# Patient Record
Sex: Male | Born: 1960 | Race: White | Hispanic: No | Marital: Married | Smoking: Never smoker
Health system: Southern US, Community
[De-identification: ages and names within clinical notes are randomized; demographics above are authoritative.]

## PROBLEM LIST (undated history)

## (undated) DIAGNOSIS — M1990 Unspecified osteoarthritis, unspecified site: Secondary | ICD-10-CM

## (undated) DIAGNOSIS — K625 Hemorrhage of anus and rectum: Secondary | ICD-10-CM

## (undated) HISTORY — DX: Hemorrhage of anus and rectum: K62.5

## (undated) HISTORY — DX: Unspecified osteoarthritis, unspecified site: M19.90

## (undated) HISTORY — PX: OTHER SURGICAL HISTORY: SHX169

---

## 2006-06-30 ENCOUNTER — Emergency Department (HOSPITAL_COMMUNITY): Admission: EM | Admit: 2006-06-30 | Discharge: 2006-06-30 | Payer: Self-pay | Admitting: Emergency Medicine

## 2006-07-03 ENCOUNTER — Ambulatory Visit: Payer: Self-pay | Admitting: Cardiology

## 2006-07-05 ENCOUNTER — Ambulatory Visit: Payer: Self-pay | Admitting: Cardiology

## 2006-07-12 ENCOUNTER — Ambulatory Visit: Payer: Self-pay

## 2006-07-28 ENCOUNTER — Ambulatory Visit: Payer: Self-pay | Admitting: Cardiology

## 2011-04-20 ENCOUNTER — Ambulatory Visit (INDEPENDENT_AMBULATORY_CARE_PROVIDER_SITE_OTHER): Payer: Self-pay | Admitting: Family Medicine

## 2011-04-20 ENCOUNTER — Encounter: Payer: Self-pay | Admitting: Family Medicine

## 2011-04-20 VITALS — BP 160/88 | HR 72 | Temp 98.0°F | Resp 12 | Ht 72.5 in | Wt 211.0 lb

## 2011-04-20 DIAGNOSIS — R030 Elevated blood-pressure reading, without diagnosis of hypertension: Secondary | ICD-10-CM

## 2011-04-20 DIAGNOSIS — N4889 Other specified disorders of penis: Secondary | ICD-10-CM

## 2011-04-20 DIAGNOSIS — N489 Disorder of penis, unspecified: Secondary | ICD-10-CM

## 2011-04-20 NOTE — Progress Notes (Signed)
  Subjective:    Patient ID: Jeffrey Mcknight, male    DOB: 06/29/1960, 50 y.o.   MRN: [id number]  HPI  New patient for 15 minute appointment. Patient recently had some mild tender left inguinal adenopathy which last only a few days. That has fully resolved. He is concerned because of some prominence of left-sided penile vein. Nonpainful. Noted about 2 weeks ago. Somewhat less prominent today. No penile discharge. No rashes. Denies fever or chills. No history of curvature of penis with erection. Patient was concerned because his wife left him about 3 months ago and he is concerned about STD issues. Again no penile discharge, fever, dysuria, or rash. He is not aware of any definite exposures. No history of STD.  Review of Systems  Constitutional: Negative for fever, chills, fatigue and unexpected weight change.  Respiratory: Negative for shortness of breath.   Cardiovascular: Negative for chest pain.  Gastrointestinal: Negative for abdominal pain.  Genitourinary: Negative for dysuria, urgency, hematuria, penile swelling, scrotal swelling, genital sores, penile pain and testicular pain.  Skin: Negative for rash.       Objective:   Physical Exam  Constitutional: He appears well-developed and well-nourished.  Cardiovascular: Normal rate and regular rhythm.   Pulmonary/Chest: Effort normal and breath sounds normal. No respiratory distress. He has no wheezes. He has no rales.  Genitourinary:       Somewhat prominent thickened vein left-sided penis. No mass. No skin changes. No urethral discharge. No inguinal adenopathy          Assessment & Plan:  Prominent vein left-sided penis. Otherwise normal exam. Nonpainful. Per patient this is improving. Observe for now. Urology referral if any further problems.  Elevated blood pressure. We strongly recommend he bring in home cuff and c/w ours. He's had consistent readings around 130 systolic or less at home.  STD screening discussed with patient and  he is not interested at this time.

## 2011-04-20 NOTE — Patient Instructions (Signed)
Monitor blood pressure and consider bringing in your BP cuff to check with ours.

## 2014-03-18 ENCOUNTER — Telehealth: Payer: Self-pay | Admitting: Family Medicine

## 2014-03-18 NOTE — Telephone Encounter (Signed)
Can you  Please schedule appointment for patient tomorrow

## 2014-03-18 NOTE — Telephone Encounter (Signed)
Pt called back and is scheduled for 03/19/14 at 0945

## 2014-03-18 NOTE — Telephone Encounter (Signed)
Patient Information:  Caller Name: Jeffrey Mcknight  Phone: [phone]  Patient: Jeffrey Mcknight, Jeffrey Mcknight  Gender: Male  DOB: 10-20-1960  Age: 53 Years  PCP: Evelena PeatBurchette, [facility](Family Practice)  Office Follow Up:  Does the office need to follow up with this patient?: Yes  Instructions For The Office: Patient has not been seen at office since 2012; one episode of bleeding with BM today.  Please follow up regarding possible appointment.  Deferred scheduling to office due to possible complex appointment needed.  Best callback number is [phone].  Thank you.   Symptoms  Reason For Call & Symptoms: Patient reports he has blood with stools onset 03/18/14 x one episode.  Toilet water is red "like red kool aid"; unable to see bottom of the toilet.  Reviewed Health History In EMR: Yes  Reviewed Medications In EMR: Yes  Reviewed Allergies In EMR: Yes  Reviewed Surgeries / Procedures: Yes  Date of Onset of Symptoms: 03/18/2014  Guideline(s) Used:  No Protocol Available - Sick Adult  Disposition Per Guideline:   See Today in Office  Reason For Disposition Reached:   Nursing judgment  Advice Given:  Call Back If:  New symptoms develop  You become worse.  Patient Will Follow Care Advice:  YES

## 2014-03-19 ENCOUNTER — Encounter: Payer: Self-pay | Admitting: Family Medicine

## 2014-03-19 ENCOUNTER — Ambulatory Visit (INDEPENDENT_AMBULATORY_CARE_PROVIDER_SITE_OTHER): Payer: BC Managed Care – PPO | Admitting: Family Medicine

## 2014-03-19 VITALS — BP 152/88 | HR 97 | Temp 97.8°F | Wt 204.0 lb

## 2014-03-19 DIAGNOSIS — K921 Melena: Secondary | ICD-10-CM

## 2014-03-19 LAB — CBC WITH DIFFERENTIAL/PLATELET
Basophils Absolute: 0 10*3/uL (ref 0.0–0.1)
Basophils Relative: 0.1 % (ref 0.0–3.0)
Eosinophils Absolute: 0.1 10*3/uL (ref 0.0–0.7)
Eosinophils Relative: 1.6 % (ref 0.0–5.0)
HCT: 50.3 % (ref 39.0–52.0)
HEMOGLOBIN: 17 g/dL (ref 13.0–17.0)
LYMPHS ABS: 2 10*3/uL (ref 0.7–4.0)
LYMPHS PCT: 27.4 % (ref 12.0–46.0)
MCHC: 33.9 g/dL (ref 30.0–36.0)
MCV: 98.1 fl (ref 78.0–100.0)
MONO ABS: 0.6 10*3/uL (ref 0.1–1.0)
MONOS PCT: 8.6 % (ref 3.0–12.0)
NEUTROS PCT: 62.3 % (ref 43.0–77.0)
Neutro Abs: 4.6 10*3/uL (ref 1.4–7.7)
Platelets: 209 10*3/uL (ref 150.0–400.0)
RBC: 5.13 Mil/uL (ref 4.22–5.81)
RDW: 12.9 % (ref 11.5–15.5)
WBC: 7.4 10*3/uL (ref 4.0–10.5)

## 2014-03-19 NOTE — Patient Instructions (Signed)
Bloody Stools  Bloody stools often mean that there is a problem in the digestive tract. Your caregiver may use the term "melena" to describe black, tarry, and bad smelling stools or "hematochezia" to describe red or maroon-colored stools. Blood seen in the stool can be caused by bleeding anywhere along the intestinal tract.   A black stool usually means that blood is coming from the upper part of the gastrointestinal tract (esophagus, stomach, or small bowel). Passing maroon-colored stools or bright red blood usually means that blood is coming from lower down in the large bowel or the rectum. However, sometimes massive bleeding in the stomach or small intestine can cause bright red bloody stools.   Consuming black licorice, lead, iron pills, medicines containing bismuth subsalicylate, or blueberries can also cause black stools. Your caregiver can test black stools to see if blood is present.  It is important that the cause of the bleeding be found. Treatment can then be started, and the problem can be corrected. Rectal bleeding may not be serious, but you should not assume everything is okay until you know the cause. It is very important to follow up with your caregiver or a specialist in gastrointestinal problems.  CAUSES   Blood in the stools can come from various underlying causes. Often, the cause is not found during your first visit. Testing is often needed to discover the cause of bleeding in the gastrointestinal tract. Causes range from simple to serious or even life-threatening. Possible causes include:  · Hemorrhoids. These are veins that are full of blood (engorged) in the rectum. They cause pain, inflammation, and may bleed.  · Anal fissures. These are areas of painful tearing which may bleed. They are often caused by passing hard stool.  · Diverticulosis. These are pouches that form on the colon over time, with age, and may bleed significantly.  · Diverticulitis. This is inflammation in areas with  diverticulosis. It can cause pain, fever, and bloody stools, although bleeding is rare.  · Proctitis and colitis. These are inflamed areas of the rectum or colon. They may cause pain, fever, and bloody stools.  · Polyps and cancer. Colon cancer is a leading cause of preventable cancer death. It often starts out as precancerous polyps that can be removed during a colonoscopy, preventing progression into cancer. Sometimes, polyps and cancer may cause rectal bleeding.  · Gastritis and ulcers. Bleeding from the upper gastrointestinal tract (near the stomach) may travel through the intestines and produce black, sometimes tarry, often bad smelling stools. In certain cases, if the bleeding is fast enough, the stools may not be black, but red and the condition may be life-threatening.  SYMPTOMS   You may have stools that are bright red and bloody, that are normal color with blood on them, or that are dark black and tarry. In some cases, you may only have blood in the toilet bowl. Any of these cases need medical care. You may also have:  · Pain at the anus or anywhere in the rectum.  · Lightheadedness or feeling faint.  · Extreme weakness.  · Nausea or vomiting.  · Fever.  DIAGNOSIS  Your caregiver may use the following methods to find the cause of your bleeding:  · Taking a medical history. Age is important. Older people tend to develop polyps and cancer more often. If there is anal pain and a hard, large stool associated with bleeding, a tear of the anus may be the cause. If blood drips into the toilet after a bowel movement, bleeding hemorrhoids may be the   problem. The color and frequency of the bleeding are additional considerations. In most cases, the medical history provides clues, but seldom the final answer.  · A visual and finger (digital) exam. Your caregiver will inspect the anal area, looking for tears and hemorrhoids. A finger exam can provide information when there is tenderness or a growth inside. In men, the  prostate is also examined.  · Endoscopy. Several types of small, long scopes (endoscopes) are used to view the colon.  ¨ In the office, your caregiver may use a rigid, or more commonly, a flexible viewing sigmoidoscope. This exam is called flexible sigmoidoscopy. It is performed in 5 to 10 minutes.  ¨ A more thorough exam is accomplished with a colonoscope. It allows your caregiver to view the entire 5 to 6 foot long colon. Medicine to help you relax (sedative) is usually given for this exam. Frequently, a bleeding lesion may be present beyond the reach of the sigmoidoscope. So, a colonoscopy may be the best exam to start with. Both exams are usually done on an outpatient basis. This means the patient does not stay overnight in the hospital or surgery center.  ¨ An upper endoscopy may be needed to examine your stomach. Sedation is used and a flexible endoscope is put in your mouth, down to your stomach.  · A barium enema X-ray. This is an X-ray exam. It uses liquid barium inserted by enema into the rectum. This test alone may not identify an actual bleeding point. X-rays highlight abnormal shadows, such as those made by lumps (tumors), diverticuli, or colitis.  TREATMENT   Treatment depends on the cause of your bleeding.   · For bleeding from the stomach or colon, the caregiver doing your endoscopy or colonoscopy may be able to stop the bleeding as part of the procedure.  · Inflammation or infection of the colon can be treated with medicines.  · Many rectal problems can be treated with creams, suppositories, or warm baths.  · Surgery is sometimes needed.  · Blood transfusions are sometimes needed if you have lost a lot of blood.  · For any bleeding problem, let your caregiver know if you take aspirin or other blood thinners regularly.  HOME CARE INSTRUCTIONS   · Take any medicines exactly as prescribed.  · Keep your stools soft by eating a diet high in fiber. Prunes (1 to 3 a day) work well for many people.  · Drink  enough water and fluids to keep your urine clear or pale yellow.  · Take sitz baths if advised. A sitz bath is when you sit in a bathtub with warm water for 10 to 15 minutes to soak, soothe, and cleanse the rectal area.  · If enemas or suppositories are advised, be sure you know how to use them. Tell your caregiver if you have problems with this.  · Monitor your bowel movements to look for signs of improvement or worsening.  SEEK MEDICAL CARE IF:   · You do not improve in the time expected.  · Your condition worsens after initial improvement.  · You develop any new symptoms.  SEEK IMMEDIATE MEDICAL CARE IF:   · You develop severe or prolonged rectal bleeding.  · You vomit blood.  · You feel weak or faint.  · You have a fever.  MAKE SURE YOU:  · Understand these instructions.  · Will watch your condition.  · Will get help right away if you are not doing well or get worse.    Document Released: 04/29/2002 Document Revised: 08/01/2011 Document Reviewed: 09/24/2010  ExitCare® Patient Information ©2015 [employer]. This information is not intended to replace advice given to you by your health care provider. Make sure you discuss any questions you have with your health care provider.

## 2014-03-19 NOTE — Progress Notes (Signed)
   Subjective:    Patient ID: Jeffrey Mcknight, male    DOB: 01-23-1961, 53 y.o.   MRN: [id number]  Rectal Bleeding  Associated symptoms include diarrhea. Pertinent negatives include no fever, no abdominal pain, no nausea, no rectal pain, no vomiting, no hematuria, no chest pain, no headaches and no coughing.   53 year old man otherwise healthy presents today acutely for rectal bleeding x 1 day.  Had a bout of diarrhea yesterday morning and later in the day had another formed movement that filled the toilet bowel with an estimated 1-2 oz of bright red blood.  No more blood has been noted since then.  No associated pain with the episode.  Denies constipation.  Never had colonoscopy or completed heme occult cards.  Denies changes in diet; hasn't eaten much since the bloody bout.  Denies fevers, N/V, SOB or chest pains.  Has been having morning gas pains that make him feel bloated and burn.  Bowel movements seem to relieve symptoms.  Goes regularly 2-3 times a day.  For past year has had a sensation that something is falling out; he thinks either internal hemorrhoids or a prolapse sensation with straining.  As a child he had chronic LLQ pains, that they assumed were diverticula.  No scopes since then to confirm.     Review of Systems  Constitutional: Negative for fever, chills, activity change, appetite change and fatigue.       Has not eaten since the event, but feels very hungry.  Respiratory: Negative for cough, chest tightness and shortness of breath.   Cardiovascular: Negative for chest pain.  Gastrointestinal: Positive for diarrhea, blood in stool, hematochezia and anal bleeding. Negative for nausea, vomiting, abdominal pain, constipation, abdominal distention and rectal pain.  Genitourinary: Negative for dysuria, frequency, hematuria and difficulty urinating.  Neurological: Negative for dizziness, syncope, weakness, numbness and headaches.       Objective:   Physical Exam  Nursing note and  vitals reviewed. Constitutional: He is oriented to person, place, and time. He appears well-developed and well-nourished. No distress.  Eyes: Conjunctivae are normal. No scleral icterus.  Cardiovascular: Normal rate, regular rhythm and normal heart sounds.  Exam reveals no gallop and no friction rub.   No murmur heard. Pulmonary/Chest: Effort normal and breath sounds normal. No respiratory distress. He has no wheezes.  Abdominal: Soft. Bowel sounds are normal. He exhibits no distension. There is no rebound and no guarding.  Slight tenderness around the umbilicus.  Genitourinary: Guaiac positive stool.  No fissures noted.  No external hemorrhoids.  Heme positive.    Neurological: He is alert and oriented to person, place, and time.  Skin: Skin is warm and dry. He is not diaphoretic. No pallor.  Normal cap refill and no petechia.    Psychiatric: He has a normal mood and affect. His behavior is normal. Judgment and thought content normal.       Assessment & Plan:  Hematochezia-  DDx: fissure, hemorrhoids, polyps.  DRE done today showing no fissure or hemorrhoids.  No red flags such as weight loss or loss of appetite to suggest malignancy.  Obtain CBC to check for anemia.  Refer to GI for colonosocpy.  No hematochezia has been noted since the isolated event.     Luiz OchoaMelissa Shankar Silber, PA-S  As above.  One episode of bright blood per rectum.  ?etiology.  We discussed possible anoscopy but he needs colonoscopy anyway (never screened) and he agrees with referral.  Delene RuffiniBruce Burchette M.D.

## 2014-03-19 NOTE — Progress Notes (Signed)
Pre visit review using our clinic review tool, if applicable. No additional management support is needed unless otherwise documented below in the visit note. 

## 2014-03-20 ENCOUNTER — Encounter: Payer: Self-pay | Admitting: Internal Medicine

## 2014-03-20 ENCOUNTER — Telehealth: Payer: Self-pay | Admitting: Family Medicine

## 2014-03-20 NOTE — Telephone Encounter (Signed)
Pt is calling to inform md [facility] can not see him until dec for colonoscopy. Please advise

## 2014-03-20 NOTE — Telephone Encounter (Signed)
Is it okay to wait till Dec. ?

## 2014-03-20 NOTE — Telephone Encounter (Signed)
Yes- unless bleeding heavy in the meantime.

## 2014-03-20 NOTE — Telephone Encounter (Signed)
Pt informed

## 2014-03-21 ENCOUNTER — Telehealth: Payer: Self-pay | Admitting: Family Medicine

## 2014-03-21 NOTE — Telephone Encounter (Signed)
Pt informed to check with [facility]. Dr Mann/Hung and Dr. Kinnie ScalesMedoff

## 2014-03-21 NOTE — Telephone Encounter (Signed)
Pt stated he is having a little more bleeding this morning. Please advise. Pt is sch for colo in mid nov

## 2014-03-21 NOTE — Telephone Encounter (Signed)
Unless this is heavy, OK to observe.  Measures to reduce constipation- fiber, fluids, stool softener.

## 2014-03-21 NOTE — Telephone Encounter (Signed)
Pt informed. Pt wants to know is there any other offices that he could call to see if he can get in sooner.

## 2014-03-26 ENCOUNTER — Ambulatory Visit (AMBULATORY_SURGERY_CENTER): Payer: Self-pay | Admitting: *Deleted

## 2014-03-26 VITALS — Ht 73.0 in | Wt 207.8 lb

## 2014-03-26 DIAGNOSIS — K625 Hemorrhage of anus and rectum: Secondary | ICD-10-CM

## 2014-03-26 MED ORDER — MOVIPREP 100 G PO SOLR: 1.0000 | Freq: Once | ORAL | Status: DC

## 2014-03-26 NOTE — Progress Notes (Signed)
No egg or soy allergy. ewm No blood thinners, no diet pills. ewm No home 02 use. ewm No sedation in the past. ewm Pt declined emmi video. ewm

## 2014-04-15 ENCOUNTER — Encounter: Payer: Self-pay | Admitting: Internal Medicine

## 2014-04-15 ENCOUNTER — Ambulatory Visit (AMBULATORY_SURGERY_CENTER): Payer: BC Managed Care – PPO | Admitting: Internal Medicine

## 2014-04-15 VITALS — BP 150/90 | HR 54 | Temp 96.3°F | Resp 11 | Ht 73.0 in | Wt 207.0 lb

## 2014-04-15 DIAGNOSIS — Z1211 Encounter for screening for malignant neoplasm of colon: Secondary | ICD-10-CM

## 2014-04-15 MED ORDER — SODIUM CHLORIDE 0.9 % IV SOLN: 500.0000 mL | INTRAVENOUS | Status: DC

## 2014-04-15 NOTE — Op Note (Signed)
[facility] [address] [place], [id number]   COLONOSCOPY PROCEDURE REPORT  PATIENT: Jeffrey Mcknight, Kendra B  MR#: [id number] BIRTHDATE: Apr 23, 1961 , 53  yrs. old GENDER: male ENDOSCOPIST: Beverley FiedlerJay M Tennile Styles, MD REFERRED XB:JYNWGBY:Bruce Burchette, M.D. PROCEDURE DATE:  04/15/2014 PROCEDURE:   Colonoscopy, screening First Screening Colonoscopy - Avg.  risk and is 50 yrs.  old or older Yes.  Prior Negative Screening - Now for repeat screening. N/A  History of Adenoma - Now for follow-up colonoscopy & has been > or = to 3 yrs.  N/A  Polyps Removed Today? No.  Polyps Removed Today? No.  Recommend repeat exam, <10 yrs? Polyps Removed Today? No.  Recommend repeat exam, <10 yrs? No. ASA CLASS:   Class II INDICATIONS:average risk for colon cancer and first colonoscopy. MEDICATIONS: Monitored anesthesia care and Propofol 300 mg IV  DESCRIPTION OF PROCEDURE:   After the risks benefits and alternatives of the procedure were thoroughly explained, informed consent was obtained.  The digital rectal exam revealed no rectal mass.   The LB [id number]  endoscope was introduced through the anus and advanced to the terminal ileum which was intubated for a short distance. No adverse events experienced.   The quality of the prep was good, using MoviPrep  The instrument was then slowly withdrawn as the colon was fully examined.    COLON FINDINGS: The examined terminal ileum appeared to be normal. There was mild diverticulosis noted in the sigmoid colon.   The examination was otherwise normal.  Retroflexed views revealed internal hemorrhoids. The time to cecum=2 minutes 21 seconds. Withdrawal time=11 minutes 05 seconds.  The scope was withdrawn and the procedure completed.  COMPLICATIONS: There were no immediate complications.  ENDOSCOPIC IMPRESSION: 1.   The examined terminal ileum appeared to be normal 2.   Mild diverticulosis was noted in the sigmoid colon 3.   The examination was  otherwise normal  RECOMMENDATIONS: You should continue to follow colorectal cancer screening guidelines for "routine risk" patients with a repeat colonoscopy in 10 years. There is no need for FOBT (stool) testing for at least 5 years.  eSigned:  Beverley FiedlerJay M Moet Mikulski, MD 04/15/2014 9:15 AM   cc: Evelena PeatBruce Burchette, MD and The Patient

## 2014-04-15 NOTE — Progress Notes (Signed)
No problems noted in the recovery room. maw 

## 2014-04-15 NOTE — Patient Instructions (Signed)
YOU HAD AN ENDOSCOPIC PROCEDURE TODAY AT THE [facility]: Refer to the procedure report that was given to you for any specific questions about what was found during the examination.  If the procedure report does not answer your questions, please call your gastroenterologist to clarify.  If you requested that your care partner not be given the details of your procedure findings, then the procedure report has been included in a sealed envelope for you to review at your convenience later.  YOU SHOULD EXPECT: Some feelings of bloating in the abdomen. Passage of more gas than usual.  Walking can help get rid of the air that was put into your GI tract during the procedure and reduce the bloating. If you had a lower endoscopy (such as a colonoscopy or flexible sigmoidoscopy) you may notice spotting of blood in your stool or on the toilet paper. If you underwent a bowel prep for your procedure, then you may not have a normal bowel movement for a few days.  DIET: Your first meal following the procedure should be a light meal and then it is ok to progress to your normal diet.  A half-sandwich or bowl of soup is an example of a good first meal.  Heavy or fried foods are harder to digest and may make you feel nauseous or bloated.  Likewise meals heavy in dairy and vegetables can cause extra gas to form and this can also increase the bloating.  Drink plenty of fluids but you should avoid alcoholic beverages for 24 hours.  ACTIVITY: Your care partner should take you home directly after the procedure.  You should plan to take it easy, moving slowly for the rest of the day.  You can resume normal activity the day after the procedure however you should NOT DRIVE or use heavy machinery for 24 hours (because of the sedation medicines used during the test).    SYMPTOMS TO REPORT IMMEDIATELY: A gastroenterologist can be reached at any hour.  During normal business hours, 8:30 AM to 5:00 PM Monday through Friday,  call [phone].  After hours and on weekends, please call the GI answering service at [phone] who will take a message and have the physician on call contact you.   Following lower endoscopy (colonoscopy or flexible sigmoidoscopy):  Excessive amounts of blood in the stool  Significant tenderness or worsening of abdominal pains  Swelling of the abdomen that is new, acute  Fever of 100F or higher   FOLLOW UP: If any biopsies were taken you will be contacted by phone or by letter within the next 1-3 weeks.  Call your gastroenterologist if you have not heard about the biopsies in 3 weeks.  Our staff will call the home number listed on your records the next business day following your procedure to check on you and address any questions or concerns that you may have at that time regarding the information given to you following your procedure. This is a courtesy call and so if there is no answer at the home number and we have not heard from you through the emergency physician on call, we will assume that you have returned to your regular daily activities without incident.  SIGNATURES/CONFIDENTIALITY: You and/or your care partner have signed paperwork which will be entered into your electronic medical record.  These signatures attest to the fact that that the information above on your After Visit Summary has been reviewed and is understood.  Full responsibility of the confidentiality of   this discharge information lies with you and/or your care-partner.     Handouts were given to your care partner on  diverticulosis, a high fiber diet with liberal fluid intake and hemorrhoids. You may resume your current medications today. Please call if any questions or concerns.   

## 2014-04-15 NOTE — Progress Notes (Signed)
Patient awakening,vss,report to rn 

## 2014-04-16 ENCOUNTER — Telehealth: Payer: Self-pay | Admitting: *Deleted

## 2014-04-16 NOTE — Telephone Encounter (Signed)
No answer, left message to call if questions or concerns. 

## 2016-12-07 ENCOUNTER — Encounter: Payer: Self-pay | Admitting: Family Medicine

## 2016-12-07 ENCOUNTER — Ambulatory Visit (INDEPENDENT_AMBULATORY_CARE_PROVIDER_SITE_OTHER): Payer: BLUE CROSS/BLUE SHIELD | Admitting: Family Medicine

## 2016-12-07 VITALS — BP 152/70 | HR 76 | Temp 97.8°F | Ht 73.0 in | Wt 218.7 lb

## 2016-12-07 DIAGNOSIS — Z125 Encounter for screening for malignant neoplasm of prostate: Secondary | ICD-10-CM

## 2016-12-07 DIAGNOSIS — Z0000 Encounter for general adult medical examination without abnormal findings: Secondary | ICD-10-CM

## 2016-12-07 LAB — BASIC METABOLIC PANEL
BUN: 19 mg/dL (ref 6–23)
CHLORIDE: 101 meq/L (ref 96–112)
CO2: 30 mEq/L (ref 19–32)
Calcium: 9.4 mg/dL (ref 8.4–10.5)
Creatinine, Ser: 1.35 mg/dL (ref 0.40–1.50)
GFR: 57.59 mL/min — AB (ref >60.00)
Glucose, Bld: 96 mg/dL (ref 70–99)
POTASSIUM: 3.7 meq/L (ref 3.5–5.1)
SODIUM: 138 meq/L (ref 135–145)

## 2016-12-07 LAB — CBC WITH DIFFERENTIAL/PLATELET
BASOS PCT: 0.2 % (ref 0.0–3.0)
Basophils Absolute: 0 10*3/uL (ref 0.0–0.1)
EOS ABS: 0.3 10*3/uL (ref 0.0–0.7)
EOS PCT: 3.6 % (ref 0.0–5.0)
HEMATOCRIT: 45.6 % (ref 39.0–52.0)
Hemoglobin: 15.7 g/dL (ref 13.0–17.0)
Lymphocytes Relative: 35 % (ref 12.0–46.0)
Lymphs Abs: 2.5 10*3/uL (ref 0.7–4.0)
MCHC: 34.5 g/dL (ref 30.0–36.0)
MCV: 99 fl (ref 78.0–100.0)
MONO ABS: 0.7 10*3/uL (ref 0.1–1.0)
Monocytes Relative: 10.1 % (ref 3.0–12.0)
NEUTROS ABS: 3.6 10*3/uL (ref 1.4–7.7)
Neutrophils Relative %: 51.1 % (ref 43.0–77.0)
PLATELETS: 171 10*3/uL (ref 150.0–400.0)
RBC: 4.61 Mil/uL (ref 4.22–5.81)
RDW: 12.9 % (ref 11.5–15.5)
WBC: 7 10*3/uL (ref 4.0–10.5)

## 2016-12-07 LAB — TSH: TSH: 2.66 u[IU]/mL (ref 0.35–4.50)

## 2016-12-07 LAB — HEPATIC FUNCTION PANEL
ALBUMIN: 4.4 g/dL (ref 3.5–5.2)
ALT: 27 U/L (ref 0–53)
AST: 19 U/L (ref 0–37)
Alkaline Phosphatase: 65 U/L (ref 39–117)
BILIRUBIN TOTAL: 0.7 mg/dL (ref 0.2–1.2)
Bilirubin, Direct: 0.1 mg/dL (ref 0.0–0.3)
Total Protein: 6.7 g/dL (ref 6.0–8.3)

## 2016-12-07 LAB — LIPID PANEL
CHOLESTEROL: 216 mg/dL — AB (ref 0–200)
HDL: 42.1 mg/dL (ref >39.00)
LDL CALC: 149 mg/dL — AB (ref 0–99)
NonHDL: 173.45
TRIGLYCERIDES: 121 mg/dL (ref 0.0–149.0)
Total CHOL/HDL Ratio: 5
VLDL: 24.2 mg/dL (ref 0.0–40.0)

## 2016-12-07 LAB — PSA: PSA: 0.77 ng/mL (ref 0.10–4.00)

## 2016-12-07 NOTE — Patient Instructions (Addendum)
Follow up for skin lesion excision. Monitor blood pressure and be in touch if consistently > 140/90.

## 2016-12-07 NOTE — Progress Notes (Signed)
Subjective:     Patient ID: Jeffrey Mcknight, male   DOB: 05/30/1960, 56 y.o.   MRN: [id number]  HPI Patient is here for physical exam. Has not had one several years and does not get any regular medical follow-up. He had colonoscopy about 3 years ago with some diverticulosis and internal hemorrhoids. Still has rare episodes of hematochezia. No constipation issues. Takes no medications.  Nonsmoker. Exercises regularly with weight lifting. History of borderline elevated blood pressure past. Home blood pressures usually around 140/70. No headaches or chest pain. Occasional slow urine stream recently. No burning with urination. Needs tetanus booster by next year  Past Medical History:  Diagnosis Date  . Arthritis   . Migraine   . Rectal bleeding    started ~03-19-14   Past Surgical History:  Procedure Laterality Date  . no past surgery      reports that he has never smoked. He has never used smokeless tobacco. He reports that he drinks alcohol. He reports that he does not use drugs. family history includes Colon polyps in his father; Hypertension in his father; Stroke (age of onset: 9780) in his father. No Known Allergies   Review of Systems  Constitutional: Negative for activity change, appetite change, fatigue and fever.  HENT: Negative for congestion, ear pain and trouble swallowing.   Eyes: Negative for pain and visual disturbance.  Respiratory: Negative for cough, shortness of breath and wheezing.   Cardiovascular: Negative for chest pain and palpitations.  Gastrointestinal: Negative for abdominal distention, abdominal pain, blood in stool, constipation, diarrhea, nausea, rectal pain and vomiting.  Genitourinary: Negative for dysuria, hematuria and testicular pain.  Musculoskeletal: Negative for arthralgias and joint swelling.  Skin: Negative for rash.  Neurological: Negative for dizziness, syncope and headaches.  Hematological: Negative for adenopathy.  Psychiatric/Behavioral:  Negative for confusion and dysphoric mood.       Objective:   Physical Exam  Constitutional: He is oriented to person, place, and time. He appears well-developed and well-nourished. No distress.  HENT:  Head: Normocephalic and atraumatic.  Right Ear: External ear normal.  Left Ear: External ear normal.  Mouth/Throat: Oropharynx is clear and moist.  Eyes: Pupils are equal, round, and reactive to light. Conjunctivae and EOM are normal.  Neck: Normal range of motion. Neck supple. No thyromegaly present.  Cardiovascular: Normal rate, regular rhythm and normal heart sounds.   No murmur heard. Pulmonary/Chest: No respiratory distress. He has no wheezes. He has no rales.  Abdominal: Soft. Bowel sounds are normal. He exhibits no distension and no mass. There is no tenderness. There is no rebound and no guarding.  Musculoskeletal: He exhibits no edema.  Lymphadenopathy:    He has no cervical adenopathy.  Neurological: He is alert and oriented to person, place, and time. He displays normal reflexes. No cranial nerve deficit.  Skin:  Patient has approximately 5-6 mm slightly raised fleshy lesion around the right trapezius area. Slightly umbilicated center  Psychiatric: He has a normal mood and affect.       Assessment:     Physical exam. Colonoscopy up-to-date. He has not had any screening lab work in several years. He has probable small basal cell carcinoma right trapezius area    Plan:     -Schedule follow-up for skin lesion excision -Discussed new shingles vaccine and he declines -Discussed hepatitis C and HIV screening and he declines -Obtain screening lab work -The natural history of prostate cancer and ongoing controversy regarding screening and potential treatment outcomes of prostate  cancer has been discussed with the patient. The meaning of a false positive PSA and a false negative PSA has been discussed. He indicates understanding of the limitations of this screening test and  wishes to proceed with screening PSA testing. -Monitor blood pressure and be in touch if consistently greater than 140/90  Kristian Covey MD [facility] at [facility]

## 2017-12-12 ENCOUNTER — Ambulatory Visit (INDEPENDENT_AMBULATORY_CARE_PROVIDER_SITE_OTHER): Payer: BLUE CROSS/BLUE SHIELD | Admitting: Family Medicine

## 2017-12-12 ENCOUNTER — Encounter: Payer: Self-pay | Admitting: Family Medicine

## 2017-12-12 VITALS — BP 130/80 | HR 61 | Temp 97.7°F | Ht 73.0 in | Wt 220.7 lb

## 2017-12-12 DIAGNOSIS — Z23 Encounter for immunization: Secondary | ICD-10-CM | POA: Diagnosis not present

## 2017-12-12 DIAGNOSIS — Z125 Encounter for screening for malignant neoplasm of prostate: Secondary | ICD-10-CM

## 2017-12-12 DIAGNOSIS — Z0000 Encounter for general adult medical examination without abnormal findings: Secondary | ICD-10-CM | POA: Diagnosis not present

## 2017-12-12 LAB — CBC WITH DIFFERENTIAL/PLATELET
BASOS PCT: 0.4 % (ref 0.0–3.0)
Basophils Absolute: 0 10*3/uL (ref 0.0–0.1)
Eosinophils Absolute: 0.2 10*3/uL (ref 0.0–0.7)
Eosinophils Relative: 3 % (ref 0.0–5.0)
HCT: 46.9 % (ref 39.0–52.0)
Hemoglobin: 15.9 g/dL (ref 13.0–17.0)
LYMPHS ABS: 2.6 10*3/uL (ref 0.7–4.0)
Lymphocytes Relative: 39.5 % (ref 12.0–46.0)
MCHC: 34 g/dL (ref 30.0–36.0)
MCV: 100 fl (ref 78.0–100.0)
MONOS PCT: 8.7 % (ref 3.0–12.0)
Monocytes Absolute: 0.6 10*3/uL (ref 0.1–1.0)
NEUTROS ABS: 3.2 10*3/uL (ref 1.4–7.7)
NEUTROS PCT: 48.4 % (ref 43.0–77.0)
PLATELETS: 188 10*3/uL (ref 150.0–400.0)
RBC: 4.69 Mil/uL (ref 4.22–5.81)
RDW: 13.1 % (ref 11.5–15.5)
WBC: 6.6 10*3/uL (ref 4.0–10.5)

## 2017-12-12 LAB — LIPID PANEL
CHOLESTEROL: 204 mg/dL — AB (ref 0–200)
HDL: 43 mg/dL (ref >39.00)
LDL Cholesterol: 138 mg/dL — ABNORMAL HIGH (ref 0–99)
NonHDL: 161.02
TRIGLYCERIDES: 113 mg/dL (ref 0.0–149.0)
Total CHOL/HDL Ratio: 5
VLDL: 22.6 mg/dL (ref 0.0–40.0)

## 2017-12-12 LAB — BASIC METABOLIC PANEL
BUN: 18 mg/dL (ref 6–23)
CALCIUM: 9.5 mg/dL (ref 8.4–10.5)
CO2: 30 mEq/L (ref 19–32)
Chloride: 100 mEq/L (ref 96–112)
Creatinine, Ser: 1.34 mg/dL (ref 0.40–1.50)
GFR: 57.89 mL/min — AB (ref >60.00)
Glucose, Bld: 92 mg/dL (ref 70–99)
POTASSIUM: 4.8 meq/L (ref 3.5–5.1)
SODIUM: 138 meq/L (ref 135–145)

## 2017-12-12 LAB — PSA: PSA: 0.74 ng/mL (ref 0.10–4.00)

## 2017-12-12 LAB — TSH: TSH: 1.64 u[IU]/mL (ref 0.35–4.50)

## 2017-12-12 LAB — HEPATIC FUNCTION PANEL
ALK PHOS: 62 U/L (ref 39–117)
ALT: 27 U/L (ref 0–53)
AST: 19 U/L (ref 0–37)
Albumin: 4.5 g/dL (ref 3.5–5.2)
Bilirubin, Direct: 0.1 mg/dL (ref 0.0–0.3)
Total Bilirubin: 0.7 mg/dL (ref 0.2–1.2)
Total Protein: 7.3 g/dL (ref 6.0–8.3)

## 2017-12-12 NOTE — Patient Instructions (Signed)
Consider new shingles (Shingrix) and check on insurance coverage if interested.    You have seen Dr Rhea BeltonPyrtle (GI) in the past.

## 2017-12-12 NOTE — Progress Notes (Signed)
  Subjective:     Patient ID: Jeffrey Mcknight, male   DOB: February 19, 1961, 57 y.o.   MRN: [id number]  HPI Patient seen for physical exam. Generally very healthy. He's had some internal hemorrhoids in the past has had some recent possible flareup. Colonoscopy 2015. Takes no medications. No history of hepatitis C screening. Needs tetanus booster. No history of shingles vaccine.  His is very physical and he gets lots of activity with that.  Past Medical History:  Diagnosis Date  . Arthritis   . Migraine   . Rectal bleeding    started ~03-19-14   Past Surgical History:  Procedure Laterality Date  . no past surgery      reports that he has never smoked. He has never used smokeless tobacco. He reports that he drinks alcohol. He reports that he does not use drugs. family history includes Colon polyps in his father; Hypertension in his father; Stroke (age of onset: 4180) in his father. No Known Allergies   Review of Systems  Constitutional: Negative for activity change, appetite change, fatigue and fever.  HENT: Negative for congestion, ear pain and trouble swallowing.   Eyes: Negative for pain and visual disturbance.  Respiratory: Negative for cough, shortness of breath and wheezing.   Cardiovascular: Negative for chest pain and palpitations.  Gastrointestinal: Negative for abdominal distention, abdominal pain, blood in stool, constipation, diarrhea, nausea, rectal pain and vomiting.  Genitourinary: Negative for dysuria, hematuria and testicular pain.  Musculoskeletal: Negative for arthralgias and joint swelling.  Skin: Negative for rash.  Neurological: Negative for dizziness, syncope and headaches.  Hematological: Negative for adenopathy.  Psychiatric/Behavioral: Negative for confusion and dysphoric mood.       Objective:   Physical Exam  Constitutional: He is oriented to person, place, and time. He appears well-developed and well-nourished. No distress.  HENT:  Head: Normocephalic and  atraumatic.  Right Ear: External ear normal.  Left Ear: External ear normal.  Mouth/Throat: Oropharynx is clear and moist.  Eyes: Pupils are equal, round, and reactive to light. Conjunctivae and EOM are normal.  Neck: Normal range of motion. Neck supple. No thyromegaly present.  Cardiovascular: Normal rate, regular rhythm and normal heart sounds.  No murmur heard. Pulmonary/Chest: No respiratory distress. He has no wheezes. He has no rales.  Abdominal: Soft. Bowel sounds are normal. He exhibits no distension and no mass. There is no tenderness. There is no rebound and no guarding.  Musculoskeletal: He exhibits no edema.  Lymphadenopathy:    He has no cervical adenopathy.  Neurological: He is alert and oriented to person, place, and time. He displays normal reflexes. No cranial nerve deficit.  Skin: No rash noted.  Psychiatric: He has a normal mood and affect.       Assessment:     Physical exam. Generally healthy 57 year old male. The following issues were discussed    Plan:     -Check labs including hepatitis C antibody. Discussed pros and cons of PSA testing and he would like to proceed with that -Tetanus booster given -Discussed shingles vaccine and he will consider-check on insurance coverage first -Patient will consider whether to follow-up with GI regarding his internal hemorrhoids  Kristian CoveyBruce W Rodrecus Belsky MD [facility]

## 2017-12-12 NOTE — Addendum Note (Signed)
Addended by: Kern ReapVEREEN, Wise Fees B on: 12/12/2017 12:09 PM   Modules accepted: Orders

## 2017-12-13 LAB — HEPATITIS C ANTIBODY
HEP C AB: NONREACTIVE
SIGNAL TO CUT-OFF: 0.02 (ref <1.00)

## 2018-10-05 ENCOUNTER — Encounter: Payer: Self-pay | Admitting: Family Medicine

## 2018-10-05 ENCOUNTER — Other Ambulatory Visit: Payer: Self-pay

## 2018-10-05 ENCOUNTER — Ambulatory Visit (INDEPENDENT_AMBULATORY_CARE_PROVIDER_SITE_OTHER): Payer: BLUE CROSS/BLUE SHIELD | Admitting: Family Medicine

## 2018-10-05 VITALS — BP 150/92 | HR 69 | Temp 97.7°F | Ht 73.0 in | Wt 219.5 lb

## 2018-10-05 DIAGNOSIS — N6320 Unspecified lump in the left breast, unspecified quadrant: Secondary | ICD-10-CM | POA: Diagnosis not present

## 2018-10-05 NOTE — Progress Notes (Signed)
  Subjective:     Patient ID: Jeffrey Mcknight, male   DOB: 1960-12-30, 58 y.o.   MRN: [id number]  HPI Patient is here with concern for possible mass left breast.  This is more medial located over 10 o'clock position.  He noticed this about 2 months ago.  Occasionally sore but for the most part nontender.  He has not noted any adenopathy.  No nipple retraction.  No nipple discharge.  Denies any recent appetite or weight changes  Past Medical History:  Diagnosis Date  . Arthritis   . Migraine   . Rectal bleeding    started ~03-19-14   Past Surgical History:  Procedure Laterality Date  . no past surgery      reports that he has never smoked. He has never used smokeless tobacco. He reports current alcohol use. He reports that he does not use drugs. family history includes Colon polyps in his father; Hypertension in his father; Stroke (age of onset: 64) in his father. No Known Allergies   Review of Systems  Constitutional: Negative for appetite change, chills, fever and unexpected weight change.  Hematological: Negative for adenopathy.       Objective:   Physical Exam Constitutional:      Appearance: Normal appearance.  Neck:     Musculoskeletal: Neck supple.  Cardiovascular:     Rate and Rhythm: Normal rate and regular rhythm.  Pulmonary:     Effort: Pulmonary effort is normal.     Breath sounds: Normal breath sounds.     Comments: Breasts are examined.  He has little bit of thickening of tissue left breast around 10 o'clock position upper medial but no distinct mass palpated.  No nipple inversion.  No nipple discharge.  No axillary adenopathy. Neurological:     Mental Status: He is alert.        Assessment:     Patient presents with concern for left breast mass.  Question of some slightly asymmetric thickening of tissue on that side compared to the right.  Suspect benign but will obtain further imaging.      Plan:     -Set up diagnostic mammogram and ultrasound if  indicated to further assess.  Kristian Covey MD [facility] at [facility]

## 2018-10-05 NOTE — Patient Instructions (Signed)
We will set up imaging of breast to further assess.

## 2018-10-25 ENCOUNTER — Other Ambulatory Visit: Payer: Self-pay

## 2018-10-25 ENCOUNTER — Ambulatory Visit
Admission: RE | Admit: 2018-10-25 | Discharge: 2018-10-25 | Disposition: A | Discharge status: Home / Self Care | Payer: BLUE CROSS/BLUE SHIELD | Source: Ambulatory Visit | Attending: Family Medicine | Admitting: Family Medicine

## 2018-10-25 ENCOUNTER — Ambulatory Visit
Admission: RE | Admit: 2018-10-25 | Discharge: 2018-10-25 | Disposition: A | Discharge status: Home / Self Care | Payer: BC Managed Care – PPO | Source: Ambulatory Visit | Attending: Family Medicine | Admitting: Family Medicine

## 2018-10-25 DIAGNOSIS — N6320 Unspecified lump in the left breast, unspecified quadrant: Secondary | ICD-10-CM

## 2019-06-20 IMAGING — MG DIGITAL DIAGNOSTIC BILATERAL MAMMOGRAM WITH TOMO AND CAD
7 series · 9 of 23 positions shown · non-contrast
Comparison: None

ACR Breast Density Category a: The breast tissue is almost entirely
fatty.

CLINICAL DATA: 58-year-old male with focal pain and thickening in
the INNER LEFT breast discovered on self-examination.

EXAM:
DIGITAL DIAGNOSTIC BILATERAL MAMMOGRAM WITH CAD AND TOMO
ULTRASOUND LEFT BREAST

[R MLO synth-2D]
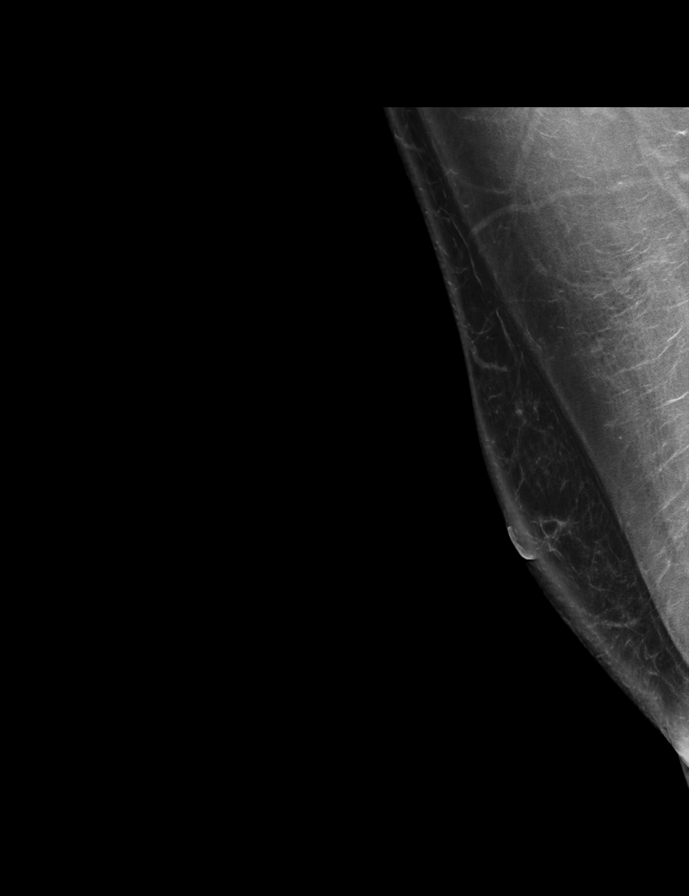

[L CC synth-2D]
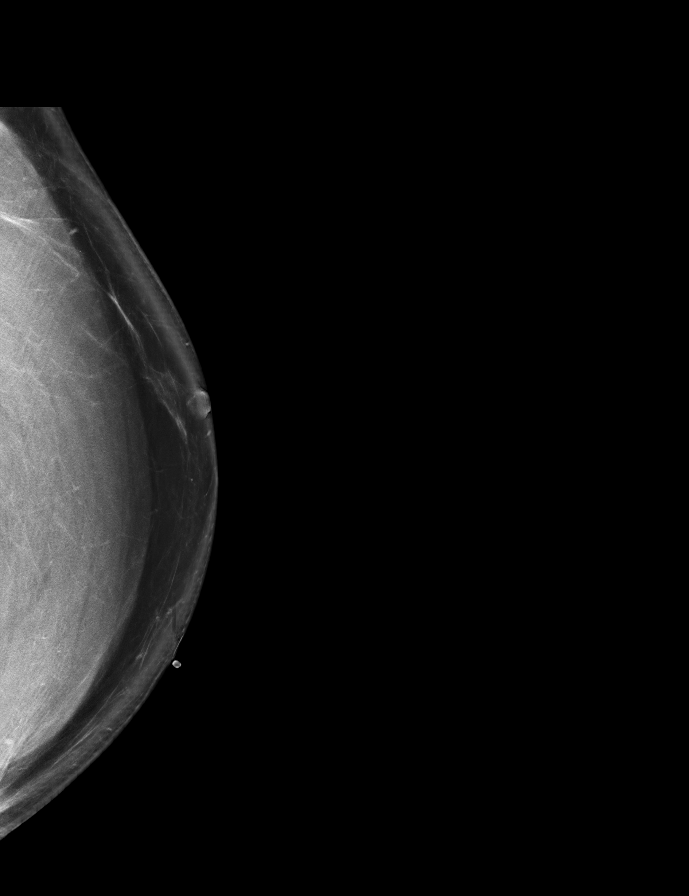

[L TAN synth-2D]
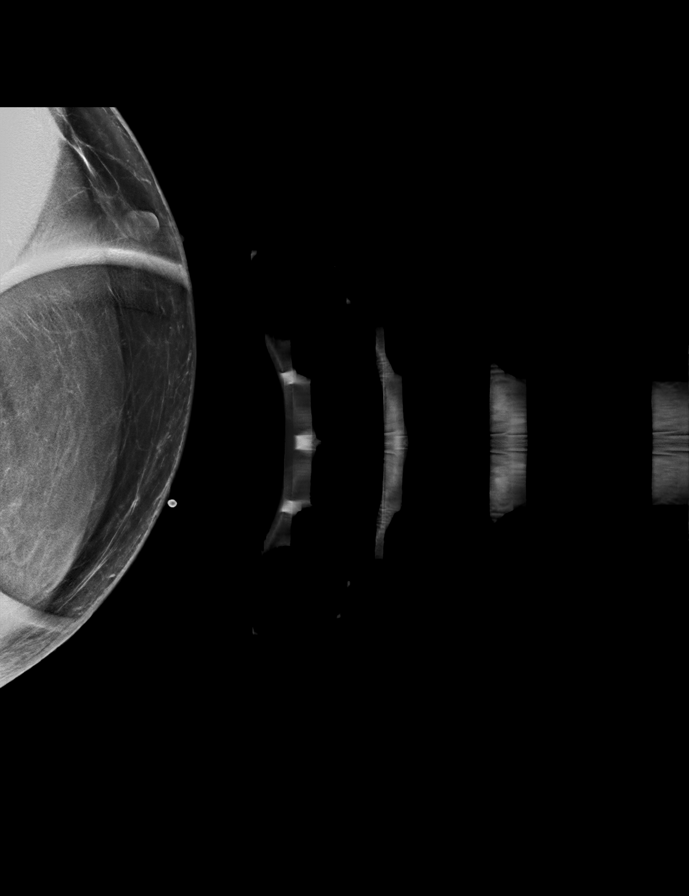

[R MLO tomo · 3 of 93 frames shown]
[frame 30/93]
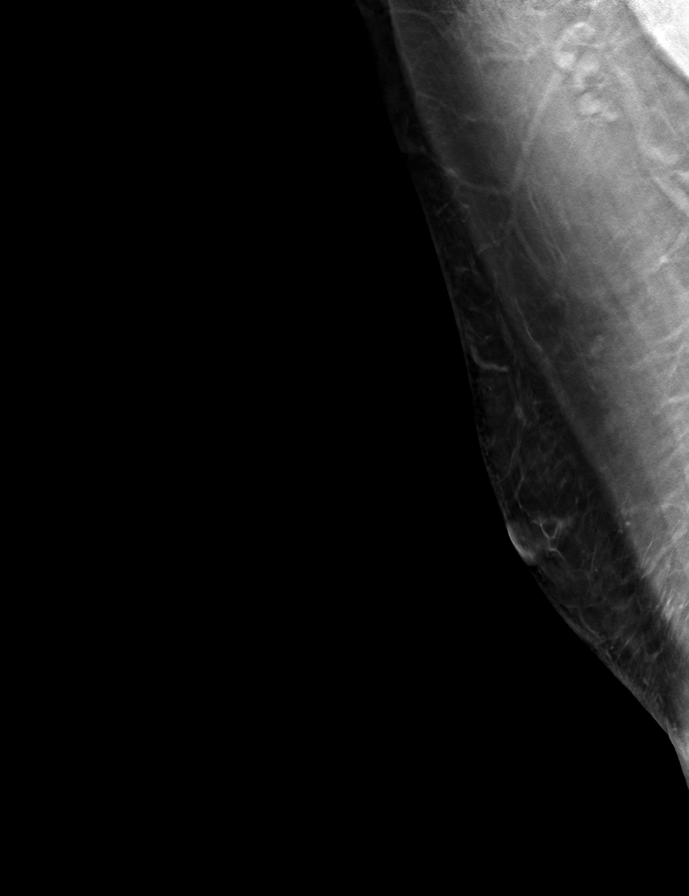
[frame 47/93]
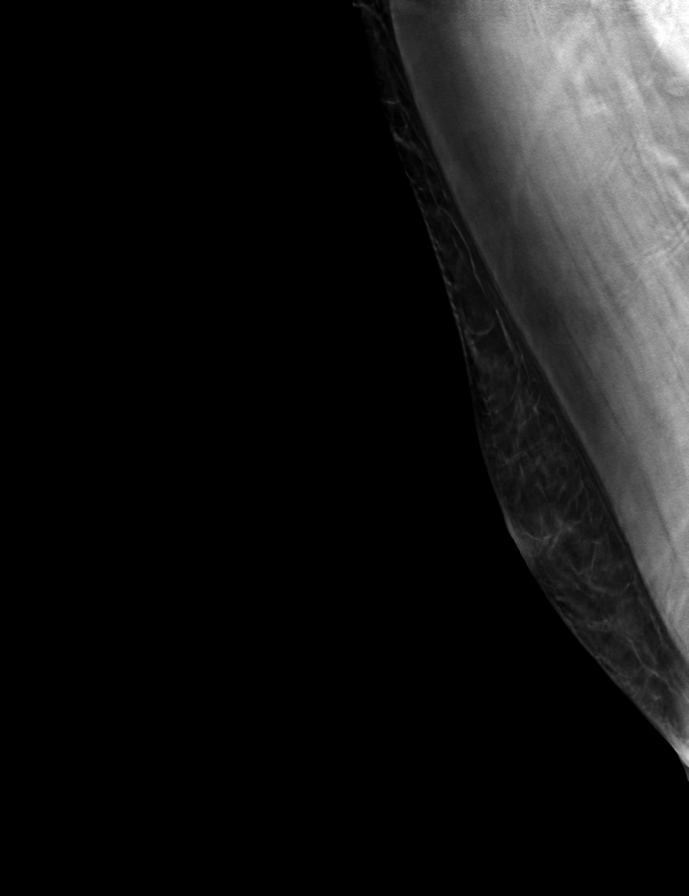
[frame 64/93]
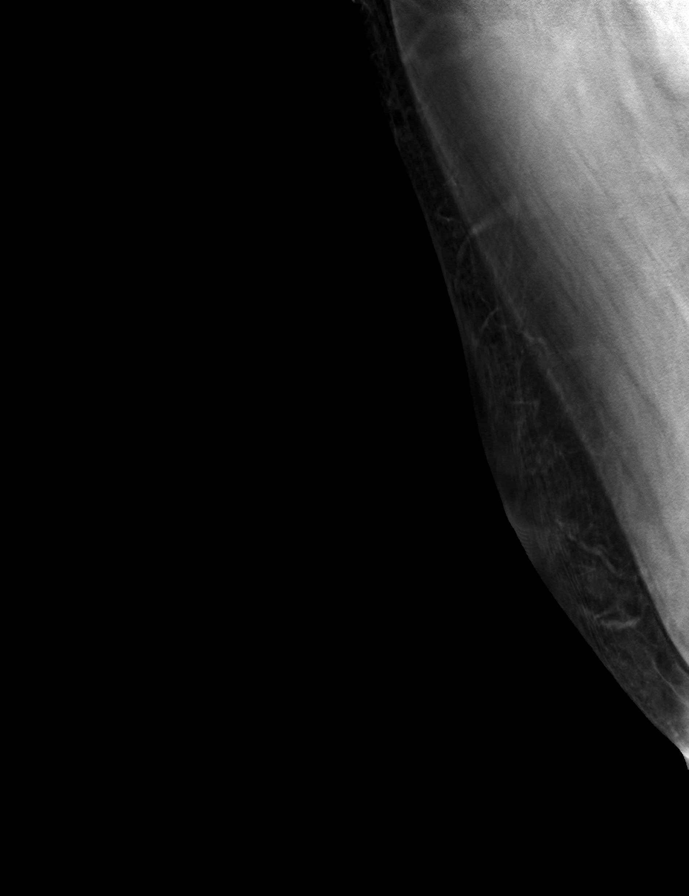

[L CC tomo · tomo slice 47/93.0]
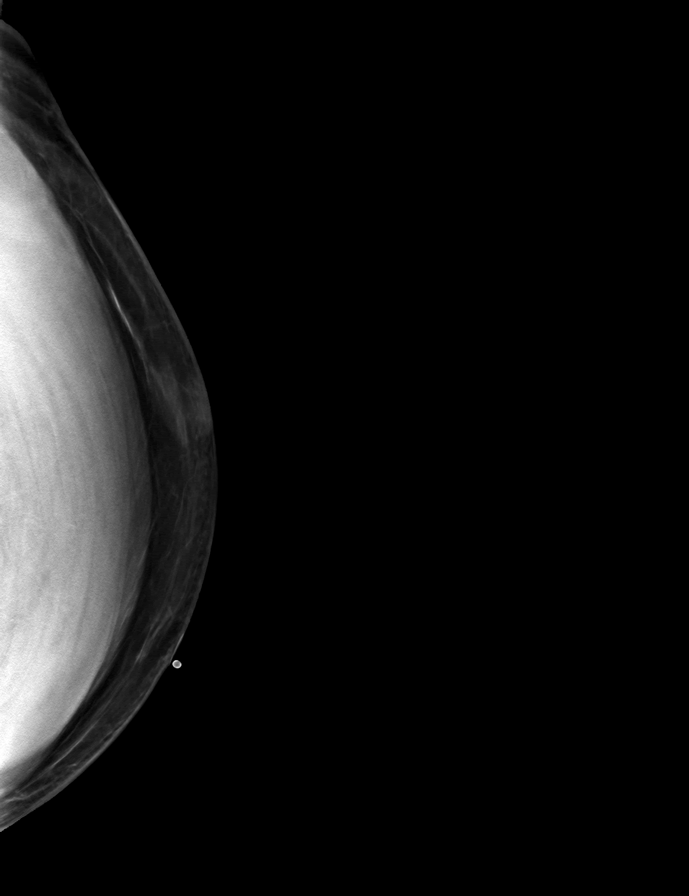

[L TAN tomo · tomo slice 35/68.0]
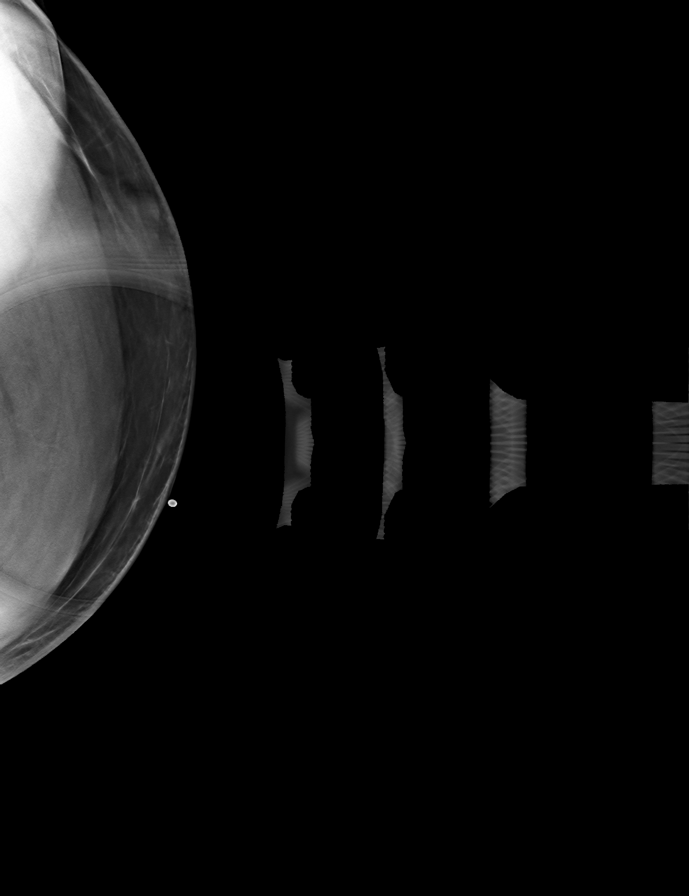

[R CC tomo · tomo slice 35/70.0]
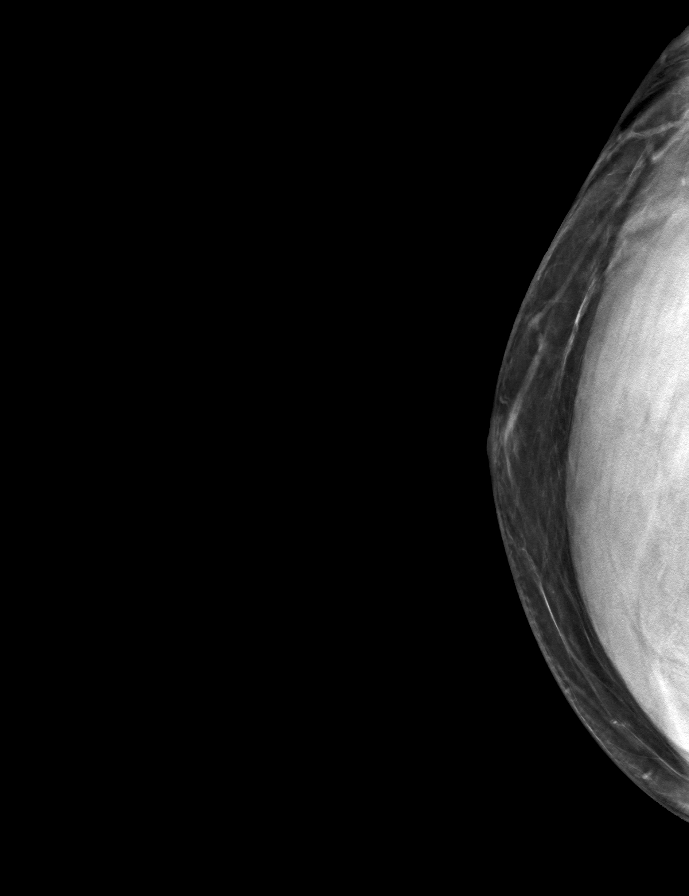

[9 of 23 positions shown; findings below may reference images not displayed]

FINDINGS: 2D/3D full field views of both breasts demonstrate no suspicious
mass, distortion or worrisome calcifications.

Mammographic images were processed with CAD.

On physical exam, no suspicious palpable abnormalities are
identified within the INNER LEFT breast, at the site of patient
concern.

Targeted ultrasound is performed, showing no solid or cystic mass,
distortion or abnormal shadowing within the INNER LEFT breast, at
the site of patient concern.
IMPRESSION: 1. No mammographic, palpable or sonographic abnormality within the
INNER LEFT breast, at the site of patient concern.
2. No mammographic evidence of breast malignancy.

RECOMMENDATION:
Clinical follow-up as indicated.

I have discussed the findings and recommendations with the patient.

BI-RADS CATEGORY  1: Negative.

## 2021-02-10 DIAGNOSIS — C4441 Basal cell carcinoma of skin of scalp and neck: Secondary | ICD-10-CM | POA: Diagnosis not present

## 2021-02-10 DIAGNOSIS — L814 Other melanin hyperpigmentation: Secondary | ICD-10-CM | POA: Diagnosis not present

## 2021-02-10 DIAGNOSIS — L570 Actinic keratosis: Secondary | ICD-10-CM | POA: Diagnosis not present

## 2021-02-10 DIAGNOSIS — C44622 Squamous cell carcinoma of skin of right upper limb, including shoulder: Secondary | ICD-10-CM | POA: Diagnosis not present

## 2021-02-10 DIAGNOSIS — D485 Neoplasm of uncertain behavior of skin: Secondary | ICD-10-CM | POA: Diagnosis not present

## 2021-02-10 DIAGNOSIS — L821 Other seborrheic keratosis: Secondary | ICD-10-CM | POA: Diagnosis not present

## 2021-02-10 DIAGNOSIS — D1801 Hemangioma of skin and subcutaneous tissue: Secondary | ICD-10-CM | POA: Diagnosis not present

## 2021-02-10 DIAGNOSIS — Z08 Encounter for follow-up examination after completed treatment for malignant neoplasm: Secondary | ICD-10-CM | POA: Diagnosis not present

## 2021-03-10 ENCOUNTER — Telehealth: Payer: Self-pay

## 2021-03-10 NOTE — Telephone Encounter (Signed)
Caller states he's had a headache for 8 days and feels like a sinus headache. This morning feels a bit better but increases and decreases throughout the day. Pain above right eye and forehead. No congestion, no fever, no other symptoms. Says one morning last week the headache did become severe for him within just a few minutes but is not currently severe. 03/10/2021 8:33:49 AM Go to ED Now (or PCP triage) Yes Reed Pandy, RN, Amy Caller Disagree/Comply Comply Caller Understands Yes PreDisposition Did not know what to do  Pt states he took Ibuprofen & noted it to be helpful. Pt states pain is similar to migraine pain.  Appt made for 03/12/21 for CPE; pt aware there may be an additional charge if multiple concerns mentioned during visit & verb understanding.  Pt declined option for CPE at later date vs acute visit.

## 2021-03-12 ENCOUNTER — Ambulatory Visit (INDEPENDENT_AMBULATORY_CARE_PROVIDER_SITE_OTHER): Payer: BC Managed Care – PPO | Admitting: Family Medicine

## 2021-03-12 ENCOUNTER — Other Ambulatory Visit: Payer: Self-pay

## 2021-03-12 VITALS — BP 194/108 | HR 69 | Temp 97.4°F | Wt 219.4 lb

## 2021-03-12 DIAGNOSIS — R519 Headache, unspecified: Secondary | ICD-10-CM

## 2021-03-12 DIAGNOSIS — I10 Essential (primary) hypertension: Secondary | ICD-10-CM

## 2021-03-12 MED ORDER — OLMESARTAN MEDOXOMIL-HCTZ 20-12.5 MG PO TABS: 1.0000 | ORAL_TABLET | Freq: Every day | ORAL | 5 refills | Status: DC

## 2021-03-12 NOTE — Progress Notes (Signed)
Established Patient Office Visit  Subjective:  Patient ID: Jeffrey Mcknight, male    DOB: 01/17/1961  Age: 60 y.o. MRN: [id number]  CC:  Chief Complaint  Patient presents with   Annual Exam    HPI Jeffrey Mcknight was initially scheduled for a physical exam.  However, he has acute issue of headache for 1 week and elevated blood pressure.  He relates headache which started out more right frontal and then left frontal.  He does have history of migraine headaches.  Occasional throbbing quality.  Relieved with Advil.  No visual changes.  No sinus congestive symptoms.  No fever.  He got his blood pressure checked and had several elevated home readings including readings of 172/90, 152/82  Very strong family history of hypertension in father and sister.  Patient does not use any alcohol.  Only occasional nonsteroidal use.  Exercises regularly.  Past Medical History:  Diagnosis Date   Arthritis    Migraine    Rectal bleeding    started ~03-19-14    Past Surgical History:  Procedure Laterality Date   no past surgery      Family History  Problem Relation Age of Onset   Stroke Father 42   Hypertension Father    Colon polyps Father    Colon cancer Neg Hx    Rectal cancer Neg Hx    Stomach cancer Neg Hx     Social History   Socioeconomic History   Marital status: Married    Spouse name: Not on file   Number of children: Not on file   Years of education: Not on file   Highest education level: Not on file  Occupational History   Not on file  Tobacco Use   Smoking status: Never   Smokeless tobacco: Never  Vaping Use   Vaping Use: Never used  Substance and Sexual Activity   Alcohol use: Yes    Alcohol/week: 0.0 standard drinks    Comment: occ beer   Drug use: No   Sexual activity: Not on file  Other Topics Concern   Not on file  Social History Narrative   Not on file   Social Determinants of Health   Financial Resource Strain: Not on file  Food Insecurity: Not on file   Transportation Needs: Not on file  Physical Activity: Not on file  Stress: Not on file  Social Connections: Not on file  Intimate Partner Violence: Not on file    Outpatient Medications Prior to Visit  Medication Sig Dispense Refill   fluorouracil (EFUDEX) 5 % cream APPLY TO THE FACE TWICE A DAY FOR 2 WEEKS AND APPLY TO FOREARMS AND ARMS TWICE A DAY FOR 4 WEEKS     ibuprofen (ADVIL,MOTRIN) 200 MG tablet Take 200 mg by mouth every 6 (six) hours as needed.       No facility-administered medications prior to visit.    No Known Allergies  ROS Review of Systems  Constitutional:  Negative for chills and fever.  Respiratory:  Negative for cough and shortness of breath.   Cardiovascular:  Negative for chest pain.  Gastrointestinal:  Negative for abdominal pain.  Neurological:  Positive for headaches. Negative for dizziness, tremors, seizures, syncope, facial asymmetry, speech difficulty and weakness.     Objective:    Physical Exam Vitals reviewed.  Constitutional:      Appearance: Normal appearance.  Eyes:     Pupils: Pupils are equal, round, and reactive to light.     Comments:  Fundi revealed no hemorrhages  Cardiovascular:     Rate and Rhythm: Normal rate and regular rhythm.     Heart sounds: No murmur heard.   No gallop.  Pulmonary:     Effort: Pulmonary effort is normal.     Breath sounds: Normal breath sounds.  Musculoskeletal:     Cervical back: Neck supple.     Right lower leg: No edema.     Left lower leg: No edema.  Lymphadenopathy:     Cervical: No cervical adenopathy.  Neurological:     General: No focal deficit present.     Mental Status: He is alert.     Cranial Nerves: No cranial nerve deficit.     Motor: No weakness.     Coordination: Coordination normal.    BP (!) 180/110 (BP Location: Left Arm, Patient Position: Sitting, Cuff Size: Normal)   Pulse 69   Temp (!) 97.4 F (36.3 C) (Oral)   Wt 219 lb 6.4 oz (99.5 kg)   SpO2 99%   BMI 28.95 kg/m   Wt Readings from Last 3 Encounters:  03/12/21 219 lb 6.4 oz (99.5 kg)  10/05/18 219 lb 8 oz (99.6 kg)  12/12/17 220 lb 11.2 oz (100.1 kg)     Health Maintenance Due  Topic Date Due   HIV Screening  Never done   Zoster Vaccines- Shingrix (1 of 2) Never done   INFLUENZA VACCINE  Never done    There are no preventive care reminders to display for this patient.  Lab Results  Component Value Date   TSH 1.64 12/12/2017   Lab Results  Component Value Date   WBC 6.6 12/12/2017   HGB 15.9 12/12/2017   HCT 46.9 12/12/2017   MCV 100.0 12/12/2017   PLT 188.0 12/12/2017   Lab Results  Component Value Date   NA 138 12/12/2017   K 4.8 12/12/2017   CO2 30 12/12/2017   GLUCOSE 92 12/12/2017   BUN 18 12/12/2017   CREATININE 1.34 12/12/2017   BILITOT 0.7 12/12/2017   ALKPHOS 62 12/12/2017   AST 19 12/12/2017   ALT 27 12/12/2017   PROT 7.3 12/12/2017   ALBUMIN 4.5 12/12/2017   CALCIUM 9.5 12/12/2017   GFR 57.89 (L) 12/12/2017   Lab Results  Component Value Date   CHOL 204 (H) 12/12/2017   Lab Results  Component Value Date   HDL 43.00 12/12/2017   Lab Results  Component Value Date   LDLCALC 138 (H) 12/12/2017   Lab Results  Component Value Date   TRIG 113.0 12/12/2017   Lab Results  Component Value Date   CHOLHDL 5 12/12/2017   No results found for: HGBA1C    Assessment & Plan:   #1 severe hypertension.  This was checked multiple times both arms after several minutes of rest and remained quite elevated.  -Start Benicar HCTZ 20/12.5 mg 1 daily.  We recommend starting combination therapy given degree of elevation. -Avoid regular use of non-steroidals -Reassess in 2 weeks -Avoid vigorous exercise until further controlled  #2 frontal headaches.  Possibly related #1. -Start the blood pressure medication and be in touch if these are not improving by next week  Meds ordered this encounter  Medications   olmesartan-hydrochlorothiazide (BENICAR HCT) 20-12.5 MG  tablet    Sig: Take 1 tablet by mouth daily.    Dispense:  30 tablet    Refill:  5    Follow-up: Return in about 2 weeks (around 03/26/2021).    Evelena Peat,  MD

## 2021-03-24 DIAGNOSIS — C44319 Basal cell carcinoma of skin of other parts of face: Secondary | ICD-10-CM | POA: Diagnosis not present

## 2021-03-24 DIAGNOSIS — L821 Other seborrheic keratosis: Secondary | ICD-10-CM | POA: Diagnosis not present

## 2021-03-24 DIAGNOSIS — C44622 Squamous cell carcinoma of skin of right upper limb, including shoulder: Secondary | ICD-10-CM | POA: Diagnosis not present

## 2021-03-26 ENCOUNTER — Other Ambulatory Visit: Payer: Self-pay

## 2021-03-26 ENCOUNTER — Ambulatory Visit (INDEPENDENT_AMBULATORY_CARE_PROVIDER_SITE_OTHER): Payer: BC Managed Care – PPO | Admitting: Family Medicine

## 2021-03-26 VITALS — BP 160/100 | HR 62 | Temp 98.2°F | Wt 219.8 lb

## 2021-03-26 DIAGNOSIS — I10 Essential (primary) hypertension: Secondary | ICD-10-CM

## 2021-03-26 DIAGNOSIS — Z0000 Encounter for general adult medical examination without abnormal findings: Secondary | ICD-10-CM

## 2021-03-26 DIAGNOSIS — Z23 Encounter for immunization: Secondary | ICD-10-CM | POA: Diagnosis not present

## 2021-03-26 MED ORDER — OLMESARTAN MEDOXOMIL-HCTZ 40-25 MG PO TABS: 1.0000 | ORAL_TABLET | Freq: Every day | ORAL | 11 refills | Status: DC

## 2021-03-26 NOTE — Addendum Note (Signed)
Addended by: Marian Sorrow D on: 03/26/2021 11:36 AM   Modules accepted: Orders

## 2021-03-26 NOTE — Progress Notes (Addendum)
Established Patient Office Visit  Subjective:  Patient ID: Jeffrey Mcknight, male    DOB: 05-May-1961  Age: 60 y.o. MRN: [id number]  CC:  Chief Complaint  Patient presents with   Follow-up    hypertension    HPI Jeffrey Mcknight presents for follow-up regarding severe hypertension.  Recently was seen with some bifrontal headaches and blood pressure 194/108.  He thought today's visit was for complete physical.  We ended up addressing his blood pressure last visit and he actually is not scheduled for physical today but he would like to go ahead and get physical labs.  We started on losartan HCTZ 20/12.5 mg 1 daily.  He has seen improvement and blood pressures and overall feels better.  Headaches have resolved at this time.  He has had mostly 0000000 to [id number] systolic recently in [id number] diastolic.  Overall greatly improved.  No regular alcohol use.  No peripheral edema.  The 10-year ASCVD risk score (Arnett DK, et al., 2019) is: 21%   Values used to calculate the score:     Age: 73 years     Sex: Male     Is Non-Hispanic African American: No     Diabetic: No     Tobacco smoker: No     Systolic Blood Pressure: 0000000 mmHg     Is BP treated: Yes     HDL Cholesterol: 43.6 mg/dL     Total Cholesterol: 234 mg/dL   Past Medical History:  Diagnosis Date   Arthritis    Migraine    Rectal bleeding    started ~03-19-14    Past Surgical History:  Procedure Laterality Date   no past surgery      Family History  Problem Relation Age of Onset   Stroke Father 72   Hypertension Father    Colon polyps Father    Colon cancer Neg Hx    Rectal cancer Neg Hx    Stomach cancer Neg Hx     Social History   Socioeconomic History   Marital status: Married    Spouse name: Not on file   Number of children: Not on file   Years of education: Not on file   Highest education level: Not on file  Occupational History   Not on file  Tobacco Use   Smoking status: Never   Smokeless tobacco: Never  Vaping Use    Vaping Use: Never used  Substance and Sexual Activity   Alcohol use: Yes    Alcohol/week: 0.0 standard drinks    Comment: occ beer   Drug use: No   Sexual activity: Not on file  Other Topics Concern   Not on file  Social History Narrative   Not on file   Social Determinants of Health   Financial Resource Strain: Not on file  Food Insecurity: Not on file  Transportation Needs: Not on file  Physical Activity: Not on file  Stress: Not on file  Social Connections: Not on file  Intimate Partner Violence: Not on file    Outpatient Medications Prior to Visit  Medication Sig Dispense Refill   fluorouracil (EFUDEX) 5 % cream APPLY TO THE FACE TWICE A DAY FOR 2 WEEKS AND APPLY TO FOREARMS AND ARMS TWICE A DAY FOR 4 WEEKS     ibuprofen (ADVIL,MOTRIN) 200 MG tablet Take 200 mg by mouth every 6 (six) hours as needed.       olmesartan-hydrochlorothiazide (BENICAR HCT) 20-12.5 MG tablet Take 1 tablet by mouth daily. [place]  tablet 5   No facility-administered medications prior to visit.    No Known Allergies  ROS Review of Systems  Constitutional:  Negative for fatigue.  Eyes:  Negative for visual disturbance.  Respiratory:  Negative for cough, chest tightness and shortness of breath.   Cardiovascular:  Negative for chest pain, palpitations and leg swelling.  Neurological:  Negative for dizziness, syncope, weakness, light-headedness and headaches.     Objective:    Physical Exam Vitals reviewed.  Constitutional:      Appearance: Normal appearance.  Cardiovascular:     Rate and Rhythm: Normal rate and regular rhythm.  Pulmonary:     Effort: Pulmonary effort is normal.     Breath sounds: Normal breath sounds. No wheezing or rales.  Musculoskeletal:     Right lower leg: No edema.     Left lower leg: No edema.  Neurological:     Mental Status: He is alert.    BP (!) 160/100 (BP Location: Left Arm, Patient Position: Sitting, Cuff Size: Normal)   Pulse 62   Temp 98.2 F (36.8  C) (Oral)   Wt 219 lb 12.8 oz (99.7 kg)   SpO2 99%   BMI 29.00 kg/m  Wt Readings from Last 3 Encounters:  03/26/21 219 lb 12.8 oz (99.7 kg)  03/12/21 219 lb 6.4 oz (99.5 kg)  10/05/18 219 lb 8 oz (99.6 kg)     Health Maintenance Due  Topic Date Due   HIV Screening  Never done   Zoster Vaccines- Shingrix (1 of 2) Never done    There are no preventive care reminders to display for this patient.  Lab Results  Component Value Date   TSH 1.64 12/12/2017   Lab Results  Component Value Date   WBC 6.6 12/12/2017   HGB 15.9 12/12/2017   HCT 46.9 12/12/2017   MCV 100.0 12/12/2017   PLT 188.0 12/12/2017   Lab Results  Component Value Date   NA 138 12/12/2017   K 4.8 12/12/2017   CO2 30 12/12/2017   GLUCOSE 92 12/12/2017   BUN 18 12/12/2017   CREATININE 1.34 12/12/2017   BILITOT 0.7 12/12/2017   ALKPHOS 62 12/12/2017   AST 19 12/12/2017   ALT 27 12/12/2017   PROT 7.3 12/12/2017   ALBUMIN 4.5 12/12/2017   CALCIUM 9.5 12/12/2017   GFR 57.89 (L) 12/12/2017   Lab Results  Component Value Date   CHOL 204 (H) 12/12/2017   Lab Results  Component Value Date   HDL 43.00 12/12/2017   Lab Results  Component Value Date   LDLCALC 138 (H) 12/12/2017   Lab Results  Component Value Date   TRIG 113.0 12/12/2017   Lab Results  Component Value Date   CHOLHDL 5 12/12/2017   No results found for: HGBA1C    Assessment & Plan:   #1 hypertension.  Greatly improved but still elevated by today's reading.  These are higher than his recent home readings.  We did compare his manual cuff with ours and had very similar reading left arm seated after rest 162/90.  This is down from 194/108 last visit  -Further titrate Benicar HCTZ to 40/25 mg -Check physical exam labs and set up physical soon.   Meds ordered this encounter  Medications   olmesartan-hydrochlorothiazide (BENICAR HCT) 40-25 MG tablet    Sig: Take 1 tablet by mouth daily.    Dispense:  30 tablet    Refill:  11     Follow-up: Return in about 6 months (  around 09/23/2021).    Jeffrey Peat, MD

## 2021-04-08 ENCOUNTER — Other Ambulatory Visit (INDEPENDENT_AMBULATORY_CARE_PROVIDER_SITE_OTHER): Payer: BC Managed Care – PPO

## 2021-04-08 DIAGNOSIS — Z0000 Encounter for general adult medical examination without abnormal findings: Secondary | ICD-10-CM | POA: Diagnosis not present

## 2021-04-08 LAB — TSH: TSH: 1.59 u[IU]/mL (ref 0.35–5.50)

## 2021-04-08 LAB — HEPATIC FUNCTION PANEL
ALT: 23 U/L (ref 0–53)
AST: 19 U/L (ref 0–37)
Albumin: 4.8 g/dL (ref 3.5–5.2)
Alkaline Phosphatase: 66 U/L (ref 39–117)
Bilirubin, Direct: 0.1 mg/dL (ref 0.0–0.3)
Total Bilirubin: 0.6 mg/dL (ref 0.2–1.2)
Total Protein: 7.4 g/dL (ref 6.0–8.3)

## 2021-04-08 LAB — CBC WITH DIFFERENTIAL/PLATELET
Basophils Absolute: 0 10*3/uL (ref 0.0–0.1)
Basophils Relative: 0.5 % (ref 0.0–3.0)
Eosinophils Absolute: 0.3 10*3/uL (ref 0.0–0.7)
Eosinophils Relative: 4.1 % (ref 0.0–5.0)
HCT: 46.3 % (ref 39.0–52.0)
Hemoglobin: 15.8 g/dL (ref 13.0–17.0)
Lymphocytes Relative: 32.9 % (ref 12.0–46.0)
Lymphs Abs: 2.7 10*3/uL (ref 0.7–4.0)
MCHC: 34.1 g/dL (ref 30.0–36.0)
MCV: 99.3 fl (ref 78.0–100.0)
Monocytes Absolute: 0.7 10*3/uL (ref 0.1–1.0)
Monocytes Relative: 8.3 % (ref 3.0–12.0)
Neutro Abs: 4.5 10*3/uL (ref 1.4–7.7)
Neutrophils Relative %: 54.2 % (ref 43.0–77.0)
Platelets: 172 10*3/uL (ref 150.0–400.0)
RBC: 4.66 Mil/uL (ref 4.22–5.81)
RDW: 12.9 % (ref 11.5–15.5)
WBC: 8.3 10*3/uL (ref 4.0–10.5)

## 2021-04-08 LAB — BASIC METABOLIC PANEL
BUN: 24 mg/dL — ABNORMAL HIGH (ref 6–23)
CO2: 31 mEq/L (ref 19–32)
Calcium: 9.5 mg/dL (ref 8.4–10.5)
Chloride: 99 mEq/L (ref 96–112)
Creatinine, Ser: 1.6 mg/dL — ABNORMAL HIGH (ref 0.40–1.50)
GFR: 45.78 mL/min — ABNORMAL LOW (ref >60.00)
Glucose, Bld: 93 mg/dL (ref 70–99)
Potassium: 3.9 mEq/L (ref 3.5–5.1)
Sodium: 136 mEq/L (ref 135–145)

## 2021-04-08 LAB — LIPID PANEL
Cholesterol: 234 mg/dL — ABNORMAL HIGH (ref 0–200)
HDL: 43.6 mg/dL (ref >39.00)
LDL Cholesterol: 159 mg/dL — ABNORMAL HIGH (ref 0–99)
NonHDL: 190.84
Total CHOL/HDL Ratio: 5
Triglycerides: 157 mg/dL — ABNORMAL HIGH (ref 0.0–149.0)
VLDL: 31.4 mg/dL (ref 0.0–40.0)

## 2021-04-08 LAB — PSA: PSA: 1.34 ng/mL (ref 0.10–4.00)

## 2021-04-09 ENCOUNTER — Other Ambulatory Visit: Payer: Self-pay

## 2021-04-09 DIAGNOSIS — E7800 Pure hypercholesterolemia, unspecified: Secondary | ICD-10-CM

## 2022-06-20 ENCOUNTER — Telehealth: Payer: Self-pay | Admitting: Family Medicine

## 2022-06-20 MED ORDER — OLMESARTAN MEDOXOMIL-HCTZ 40-25 MG PO TABS: 1.0000 | ORAL_TABLET | Freq: Every day | ORAL | 0 refills | Status: DC

## 2022-06-20 NOTE — Telephone Encounter (Signed)
Prescription Request  06/20/2022  Is this a "Controlled Substance" medicine? No  LOV: Visit date not found 04-11-2022  What is the name of the medication or equipment? olmesartan-hydrochlorothiazide (BENICAR HCT) 40-25 MG tablet   Have you contacted your pharmacy to request a refill? No . Pt has cpe sch for 07-11-2022  Which pharmacy would you like this sent to?  [facility], [place] - [address] [place] [place] [address] Phone: [phone] Fax: [phone]    Patient notified that their request is being sent to the clinical staff for review and that they should receive a response within 2 business days.   Please advise at Mobile [phone] (mobile)

## 2022-06-20 NOTE — Telephone Encounter (Signed)
Rx sent 

## 2022-07-11 ENCOUNTER — Encounter: Payer: Self-pay | Admitting: Family Medicine

## 2022-07-11 ENCOUNTER — Ambulatory Visit (INDEPENDENT_AMBULATORY_CARE_PROVIDER_SITE_OTHER): Payer: BC Managed Care – PPO | Admitting: Family Medicine

## 2022-07-11 VITALS — BP 142/80 | HR 60 | Temp 97.4°F | Ht 73.23 in | Wt 230.9 lb

## 2022-07-11 DIAGNOSIS — Z0000 Encounter for general adult medical examination without abnormal findings: Secondary | ICD-10-CM | POA: Diagnosis not present

## 2022-07-11 LAB — LIPID PANEL
Cholesterol: 246 mg/dL — ABNORMAL HIGH (ref 0–200)
HDL: 41 mg/dL (ref >39.00)
LDL Cholesterol: 166 mg/dL — ABNORMAL HIGH (ref 0–99)
NonHDL: 204.59
Total CHOL/HDL Ratio: 6
Triglycerides: 195 mg/dL — ABNORMAL HIGH (ref 0.0–149.0)
VLDL: 39 mg/dL (ref 0.0–40.0)

## 2022-07-11 LAB — HEPATIC FUNCTION PANEL
ALT: 24 U/L (ref 0–53)
AST: 19 U/L (ref 0–37)
Albumin: 4.6 g/dL (ref 3.5–5.2)
Alkaline Phosphatase: 61 U/L (ref 39–117)
Bilirubin, Direct: 0.1 mg/dL (ref 0.0–0.3)
Total Bilirubin: 0.6 mg/dL (ref 0.2–1.2)
Total Protein: 7.3 g/dL (ref 6.0–8.3)

## 2022-07-11 LAB — BASIC METABOLIC PANEL
BUN: 23 mg/dL (ref 6–23)
CO2: 30 mEq/L (ref 19–32)
Calcium: 10.1 mg/dL (ref 8.4–10.5)
Chloride: 98 mEq/L (ref 96–112)
Creatinine, Ser: 1.49 mg/dL (ref 0.40–1.50)
GFR: 49.43 mL/min — ABNORMAL LOW (ref >60.00)
Glucose, Bld: 93 mg/dL (ref 70–99)
Potassium: 4.2 mEq/L (ref 3.5–5.1)
Sodium: 136 mEq/L (ref 135–145)

## 2022-07-11 LAB — CBC WITH DIFFERENTIAL/PLATELET
Basophils Absolute: 0 10*3/uL (ref 0.0–0.1)
Basophils Relative: 0.3 % (ref 0.0–3.0)
Eosinophils Absolute: 0.3 10*3/uL (ref 0.0–0.7)
Eosinophils Relative: 3.8 % (ref 0.0–5.0)
HCT: 47.6 % (ref 39.0–52.0)
Hemoglobin: 16.2 g/dL (ref 13.0–17.0)
Lymphocytes Relative: 33.9 % (ref 12.0–46.0)
Lymphs Abs: 2.5 10*3/uL (ref 0.7–4.0)
MCHC: 34.1 g/dL (ref 30.0–36.0)
MCV: 98.3 fl (ref 78.0–100.0)
Monocytes Absolute: 0.6 10*3/uL (ref 0.1–1.0)
Monocytes Relative: 8.4 % (ref 3.0–12.0)
Neutro Abs: 3.9 10*3/uL (ref 1.4–7.7)
Neutrophils Relative %: 53.6 % (ref 43.0–77.0)
Platelets: 164 10*3/uL (ref 150.0–400.0)
RBC: 4.84 Mil/uL (ref 4.22–5.81)
RDW: 13.6 % (ref 11.5–15.5)
WBC: 7.3 10*3/uL (ref 4.0–10.5)

## 2022-07-11 LAB — TSH: TSH: 2.44 u[IU]/mL (ref 0.35–5.50)

## 2022-07-11 LAB — PSA: PSA: 1.57 ng/mL (ref 0.10–4.00)

## 2022-07-11 MED ORDER — SILDENAFIL CITRATE 20 MG PO TABS: ORAL_TABLET | ORAL | 2 refills | Status: AC

## 2022-07-11 MED ORDER — OLMESARTAN MEDOXOMIL-HCTZ 40-25 MG PO TABS: 1.0000 | ORAL_TABLET | Freq: Every day | ORAL | 3 refills | Status: AC

## 2022-07-11 NOTE — Progress Notes (Signed)
Established Patient Office Visit  Subjective   Patient ID: Jeffrey Mcknight, male    DOB: 1960-09-22  Age: 62 y.o. MRN: [id number]  Chief Complaint  Patient presents with   Annual Exam    HPI   Jeffrey Mcknight is seen today for physical exam.  He has hypertension treated with olmesartan HCTZ.  Only taking currently 1/2 tablet daily.  He states when he took a full tablet he had some atypical chest pain but never only takes half a tablet.  No orthostatic symptoms.  He does have some ED issues and would like to explore possible medication options.  Has never tried Viagra previously.  No nitroglycerin use.  Generally doing well though he has significant arthritis complaints multiple joints.  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  He had stress issues dealing with 34 year old son who is a drug abuser.  Health maintenance reviewed -Prior hepatitis C antibody negative -Declines flu vaccine and Shingrix -Colonoscopy due 2025 -Tetanus due 2029  Family history-his father died of complications of stroke at age 71.  He had hypertension history.  Mom just died this past year age 69 apparently from choking on food at a residential care facility.  Social history-he is married.  This is his second marriage.  He has 28 year old son with history of drug abuse.  He has a stepson who just recently graduated from pharmacy school.  Patient is non-smoker.  No regular alcohol.  Past Medical History:  Diagnosis Date   Arthritis    Migraine    Rectal bleeding    started ~03-19-14   Past Surgical History:  Procedure Laterality Date   no past surgery      reports that he has never smoked. He has never used smokeless tobacco. He reports current alcohol use. He reports that he does not use drugs. family history includes Colon polyps in his father; Hypertension in his father; Stroke (age of onset: 19) in his father. No Known Allergies  Review of Systems  Constitutional:  Negative for  chills, fever, malaise/fatigue and weight loss.  HENT:  Negative for hearing loss.   Eyes:  Negative for blurred vision and double vision.  Respiratory:  Negative for cough and shortness of breath.   Cardiovascular:  Negative for chest pain, palpitations and leg swelling.  Gastrointestinal:  Negative for abdominal pain, blood in stool, constipation, diarrhea, nausea and vomiting.  Genitourinary:  Negative for dysuria.  Skin:  Negative for rash.  Neurological:  Negative for dizziness, speech change, seizures, loss of consciousness and headaches.  Psychiatric/Behavioral:  Negative for depression.       Objective:     BP (!) 142/80 (BP Location: Left Arm, Patient Position: Sitting, Cuff Size: Large)   Pulse 60   Temp (!) 97.4 F (36.3 C) (Oral)   Ht 6' 1.23" (1.86 m)   Wt 230 lb 14.4 oz (104.7 kg)   SpO2 96%   BMI 30.27 kg/m  BP Readings from Last 3 Encounters:  07/11/22 (!) 142/80  03/26/21 (!) 160/100  03/12/21 (!) 194/108   Wt Readings from Last 3 Encounters:  07/11/22 230 lb 14.4 oz (104.7 kg)  03/26/21 219 lb 12.8 oz (99.7 kg)  03/12/21 219 lb 6.4 oz (99.5 kg)      Physical Exam Vitals reviewed.  Constitutional:      General: He is not in acute distress.    Appearance: He is well-developed.  HENT:     Head: Normocephalic and atraumatic.  Right Ear: External ear normal.     Left Ear: External ear normal.  Eyes:     Conjunctiva/sclera: Conjunctivae normal.     Pupils: Pupils are equal, round, and reactive to light.  Neck:     Thyroid: No thyromegaly.  Cardiovascular:     Rate and Rhythm: Normal rate and regular rhythm.     Heart sounds: Normal heart sounds. No murmur heard. Pulmonary:     Effort: No respiratory distress.     Breath sounds: No wheezing or rales.  Abdominal:     General: Bowel sounds are normal. There is no distension.     Palpations: Abdomen is soft. There is no mass.     Tenderness: There is no abdominal tenderness. There is no guarding  or rebound.  Musculoskeletal:     Cervical back: Normal range of motion and neck supple.     Right lower leg: No edema.     Left lower leg: No edema.  Lymphadenopathy:     Cervical: No cervical adenopathy.  Skin:    Findings: No rash.     Comments: Generally dry skin but no specific rash  Neurological:     Mental Status: He is alert and oriented to person, place, and time.     Cranial Nerves: No cranial nerve deficit.      No results found for any visits on 07/11/22.    The 10-year ASCVD risk score (Arnett DK, et al., 2019) is: 19.7%    Assessment & Plan:   Problem List Items Addressed This Visit   None Visit Diagnoses     Physical exam    -  Primary   Relevant Orders   Basic metabolic panel   Lipid panel   CBC with Differential/Platelet   TSH   Hepatic function panel   PSA     -Blood pressure borderline elevated here today but consistent readings at home around [id number] systolic and around 80 diastolic -We discussed vaccines such as Shingrix and he declines -Recommend lab work as above -Continue close home monitoring of blood pressure -He has had elevated lipids in the past.  He is fairly adamant that he does not wish to look at statins regardless of cholesterol numbers or predicted 10-year risk of CAD -Refill Benicar HCTZ for 1 year -Has had some ED issues and we discussed trial of generic sildenafil.  No nitroglycerin use.  No follow-ups on file.    Carolann Littler, MD

## 2024-03-06 DIAGNOSIS — L821 Other seborrheic keratosis: Secondary | ICD-10-CM | POA: Diagnosis not present

## 2024-03-06 DIAGNOSIS — L814 Other melanin hyperpigmentation: Secondary | ICD-10-CM | POA: Diagnosis not present

## 2024-03-06 DIAGNOSIS — Z85828 Personal history of other malignant neoplasm of skin: Secondary | ICD-10-CM | POA: Diagnosis not present

## 2024-03-06 DIAGNOSIS — L570 Actinic keratosis: Secondary | ICD-10-CM | POA: Diagnosis not present

## 2024-03-06 DIAGNOSIS — D1801 Hemangioma of skin and subcutaneous tissue: Secondary | ICD-10-CM | POA: Diagnosis not present

## 2024-03-06 DIAGNOSIS — L578 Other skin changes due to chronic exposure to nonionizing radiation: Secondary | ICD-10-CM | POA: Diagnosis not present

## 2024-03-06 DIAGNOSIS — L2989 Other pruritus: Secondary | ICD-10-CM | POA: Diagnosis not present

## 2024-03-06 DIAGNOSIS — Z789 Other specified health status: Secondary | ICD-10-CM | POA: Diagnosis not present

## 2024-03-06 DIAGNOSIS — L538 Other specified erythematous conditions: Secondary | ICD-10-CM | POA: Diagnosis not present

## 2024-03-06 DIAGNOSIS — L820 Inflamed seborrheic keratosis: Secondary | ICD-10-CM | POA: Diagnosis not present

## 2024-03-06 DIAGNOSIS — Z08 Encounter for follow-up examination after completed treatment for malignant neoplasm: Secondary | ICD-10-CM | POA: Diagnosis not present
